# Patient Record
Sex: Male | Born: 2000 | Race: White | Hispanic: No | Marital: Single | Smoking: Current some day smoker
Health system: Southern US, Community
[De-identification: ages and names within clinical notes are randomized; demographics above are authoritative.]

## PROBLEM LIST (undated history)

## (undated) DIAGNOSIS — T7840XA Allergy, unspecified, initial encounter: Secondary | ICD-10-CM

## (undated) DIAGNOSIS — Z8781 Personal history of (healed) traumatic fracture: Secondary | ICD-10-CM

## (undated) HISTORY — DX: Personal history of (healed) traumatic fracture: Z87.81

## (undated) HISTORY — PX: WISDOM TOOTH EXTRACTION: SHX21

## (undated) HISTORY — DX: Allergy, unspecified, initial encounter: T78.40XA

---
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
## 2018-04-17 IMAGING — CT CT MAXILLOFACIAL W/O CM
4 of 7 series · 16 of 47 positions shown, 18 images · non-contrast
Comparison: None.

CLINICAL DATA: Loss of consciousness at site of blood. Fall. Bloody
nose and neck pain. Initial encounter.

EXAM:
CT MAXILLOFACIAL WITHOUT CONTRAST
CT CERVICAL SPINE WITHOUT CONTRAST
TECHNIQUE: Multidetector CT imaging of the maxillofacial structures was
performed. Multiplanar CT image reconstructions were also generated.
A small metallic BB was placed on the right temple in order to
reliably differentiate right from left.
Multidetector CT imaging of the cervical spine was performed without
intravenous contrast. Multiplanar CT image reconstructions were also
generated.

[Series 12: c spine soft · axial · 0.23mm/px · z∈[-117,-27]mm · 5 of 90 slices shown]
[im 9/90  brain]
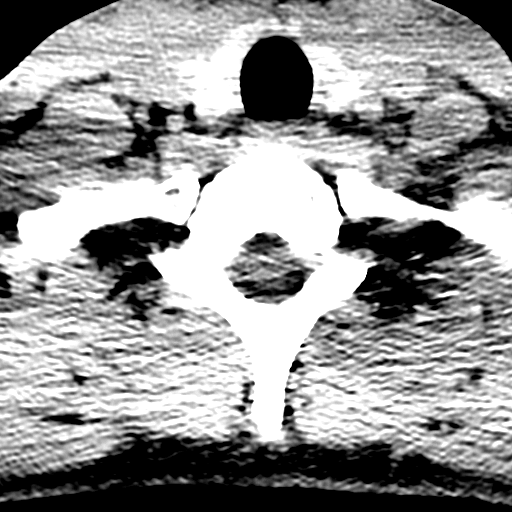
[im 18/90  brain]
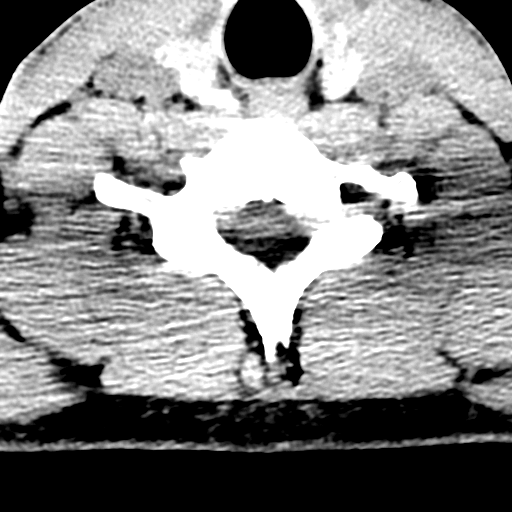
[im 27/90  brain]
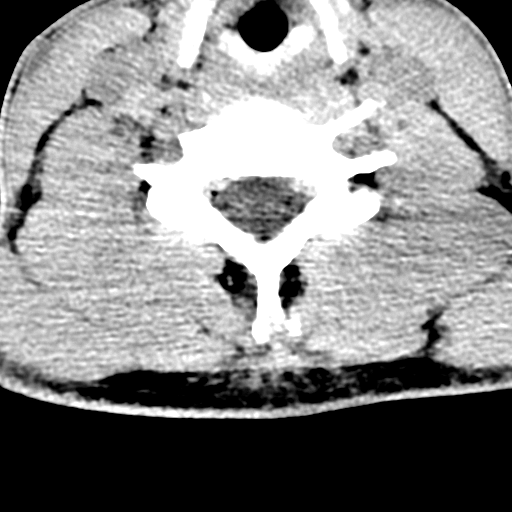
[im 36/90  brain]
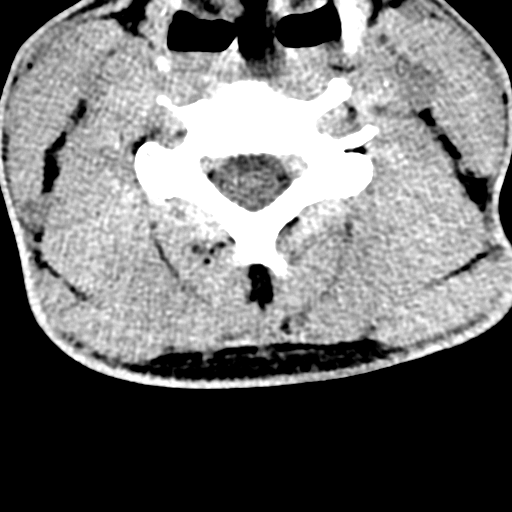
[im 54/90  brain]
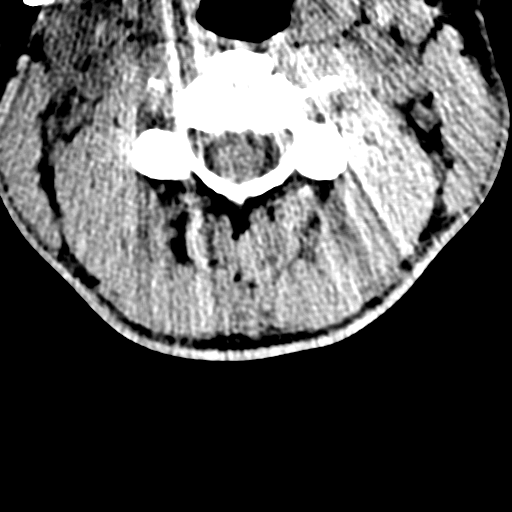

[Series 13: sagittal bone · sagittal · 0.26mm/px · 1 of 53 slices shown]
[im 27/53  bone]
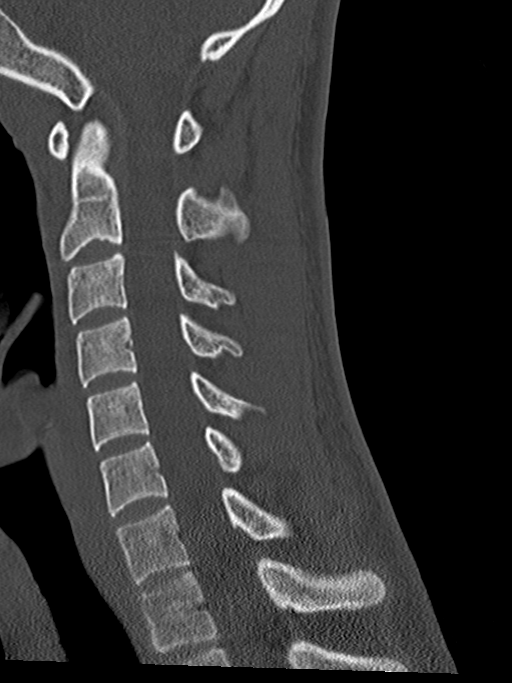

[Series 14: coronal bone · coronal · 0.26mm/px · 2 of 43 slices shown]
[im 15/43  bone]
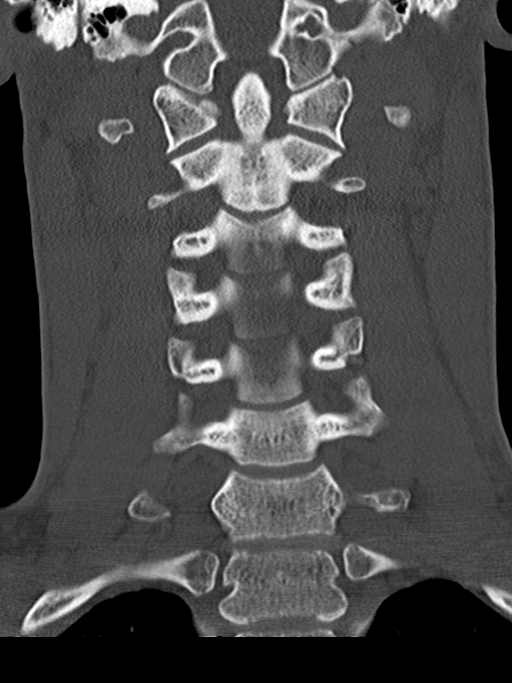
[im 29/43  bone]
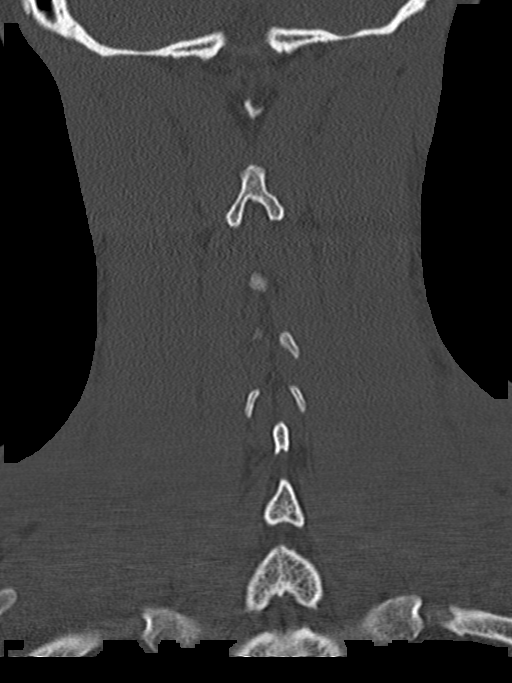

[Series 15: orthogonal axials · axial · 0.21mm/px · z∈[-128,-7]mm · 8 of 84 slices shown, 10 images]
[im 10/84  brain]
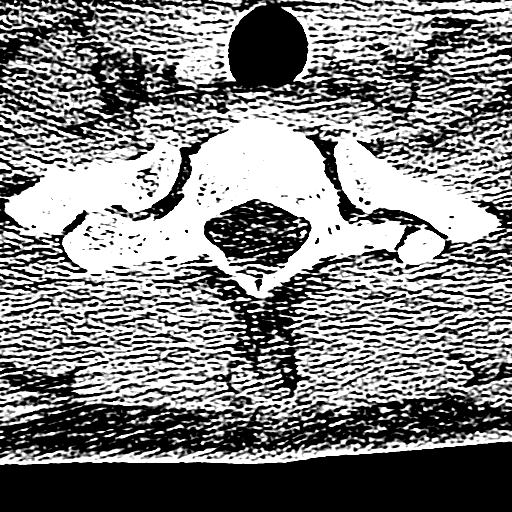
[im 10/84  bone]
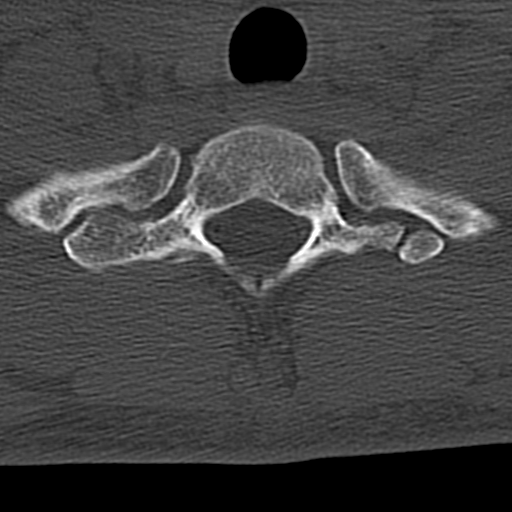
[im 19/84  bone]
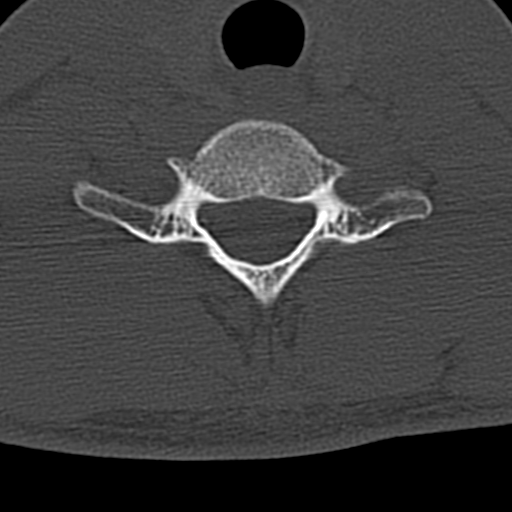
[im 28/84  bone]
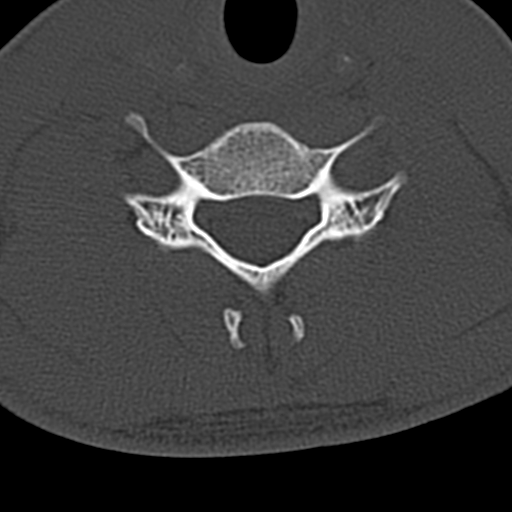
[im 37/84  bone]
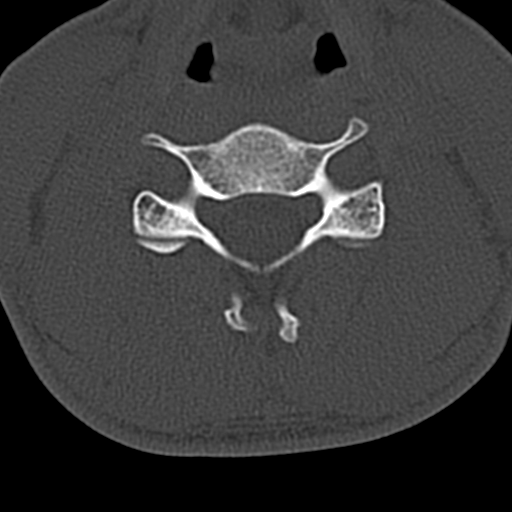
[im 47/84  brain]
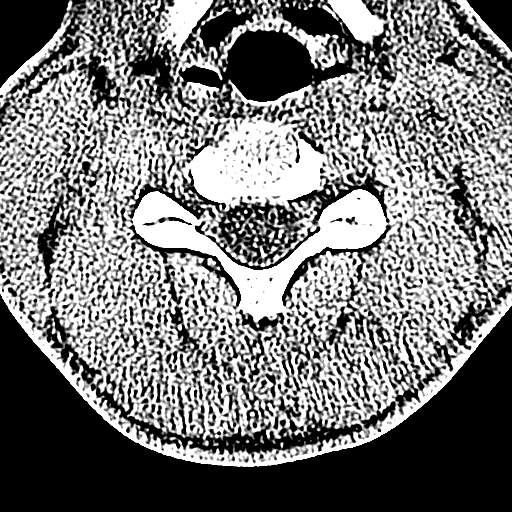
[im 47/84  bone]
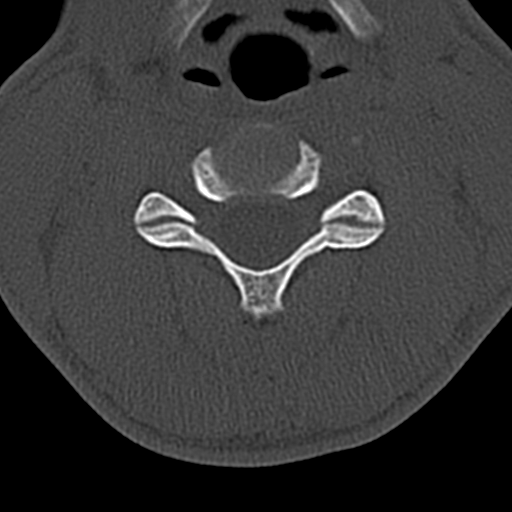
[im 56/84  bone]
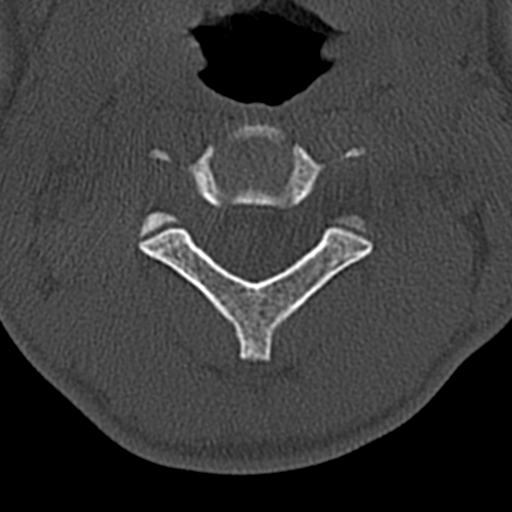
[im 65/84  bone]
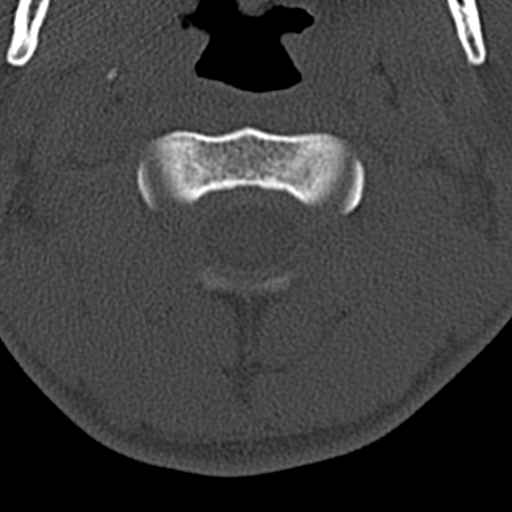
[im 74/84  bone]
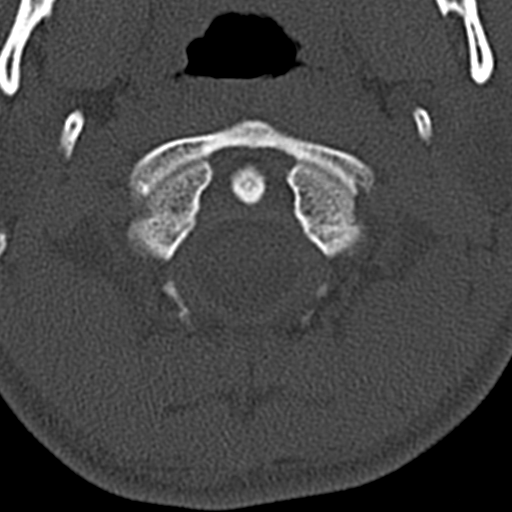

[16 of 47 positions shown; findings below may reference images not displayed]

FINDINGS: CT MAXILLOFACIAL FINDINGS

Osseous: Minimally depressed fracture of the nasal arch that is most
convincing on sagittal reformats. Intact nasal septum. No orbital or
ethmoid continuation.

Orbits: No evidence of injury

Sinuses: Negative.  No hemosinus.

Soft tissues: No contusion or opaque foreign body.

Limited intracranial: Negative

CT CERVICAL FINDINGS

Alignment: Normal

Skull base and vertebrae: Negative for fracture

Soft tissues and spinal canal: No prevertebral fluid or swelling. No
visible canal hematoma.

Disc levels:  No degenerative changes

Upper chest: Minimal coverage that is negative.
IMPRESSION: Maxillofacial CT:

Nasal arch fracture with mild depression.

Cervical spine CT:

Normal.

## 2018-11-23 ENCOUNTER — Ambulatory Visit (INDEPENDENT_AMBULATORY_CARE_PROVIDER_SITE_OTHER): Payer: Self-pay | Admitting: Family Medicine

## 2018-11-23 ENCOUNTER — Encounter: Payer: Self-pay | Admitting: Family Medicine

## 2018-11-23 ENCOUNTER — Other Ambulatory Visit: Payer: Self-pay

## 2018-11-23 DIAGNOSIS — R1013 Epigastric pain: Secondary | ICD-10-CM

## 2018-11-23 MED ORDER — PANTOPRAZOLE SODIUM 40 MG PO TBEC: DELAYED_RELEASE_TABLET | ORAL | 5 refills | Status: DC

## 2018-11-23 NOTE — Progress Notes (Signed)
   Subjective:  Audio  Patient ID: Alexander Gilbert, male    DOB: [birth year], 18 y.o.   MRN: [id number]  HPI Pt states he has been having stomach issues since freshman year in high school. Pt states that sometimes his stomach hurts for a few hours sometimes not at all. Pt states has has an area on his buttock that will be hard and painful at times and other times the area will be small and non painful. Pt has popped the area with a needle.   Virtual Visit via Video Note  I connected with Rylend Pietrzak on 11/23/18 at 11:00 AM EDT by a video enabled telemedicine application and verified that I am speaking with the correct person using two identifiers.  Location: Patient: home Provider: office   I discussed the limitations of evaluation and management by telemedicine and the availability of in person appointments. The patient expressed understanding and agreed to proceed.  History of Present Illness:    Observations/Objective:   Assessment and Plan:   Follow Up Instructions:    I discussed the assessment and treatment plan with the patient. The patient was provided an opportunity to ask questions and all were answered. The patient agreed with the plan and demonstrated an understanding of the instructions.   The patient was advised to call back or seek an in-person evaluation if the symptoms worsen or if the condition fails to improve as anticipated.  I provided 37minutes of non-face-to-face time during this encounter.   Vicente Males, LPN  Patient has intermittent cysts perirectal region.  Swells up a bit and then will drain and then goes almost gone away.  Has had it for a long time  Abdominal discomfort mid abdomen.  Usually worse after meals.  Uncomfortable sensation.  Has seen gastroenterologist in the past, even though he had forgotte.  No major change in bowel habits.  Review of Systems No headache, no major weight loss or weight gain, no chest pain no back pain abdominal pain  no change in bowel habits complete ROS otherwise negative     Objective:   Physical Exam   Virtual     Assessment & Plan:  Impression recurrent cyst perirectal at this time quiet patient wishes to pursue no further  2.  Reflux Protonix prescribed symptom care discussed if persists will consider GI referral exam

## 2019-06-07 ENCOUNTER — Encounter: Payer: Self-pay | Admitting: Family Medicine

## 2019-06-08 ENCOUNTER — Encounter: Payer: Self-pay | Admitting: Family Medicine

## 2020-05-16 ENCOUNTER — Encounter: Payer: Self-pay | Admitting: Family Medicine

## 2020-05-16 ENCOUNTER — Other Ambulatory Visit: Payer: Self-pay

## 2020-05-16 ENCOUNTER — Ambulatory Visit (INDEPENDENT_AMBULATORY_CARE_PROVIDER_SITE_OTHER): Payer: Self-pay | Admitting: Family Medicine

## 2020-05-16 VITALS — HR 65 | Temp 97.5°F | Resp 16 | Ht 69.0 in

## 2020-05-16 DIAGNOSIS — H66002 Acute suppurative otitis media without spontaneous rupture of ear drum, left ear: Secondary | ICD-10-CM | POA: Insufficient documentation

## 2020-05-16 DIAGNOSIS — J029 Acute pharyngitis, unspecified: Secondary | ICD-10-CM

## 2020-05-16 DIAGNOSIS — H66001 Acute suppurative otitis media without spontaneous rupture of ear drum, right ear: Secondary | ICD-10-CM

## 2020-05-16 LAB — POCT RAPID STREP A (OFFICE): Rapid Strep A Screen: NEGATIVE

## 2020-05-16 MED ORDER — AMOXICILLIN 500 MG PO CAPS: 500.0000 mg | ORAL_CAPSULE | Freq: Two times a day (BID) | ORAL | 0 refills | Status: DC

## 2020-05-16 NOTE — Progress Notes (Signed)
Patient ID: Alexander Gilbert, male    DOB: Sep 20, 2000, 20 y.o.   MRN: [id number]   Chief Complaint  Patient presents with  . Sore Throat   Subjective:  CC: sore throat  This is a new problem.  Presents today for an acute visit with a complaint of sore throat and fever.  He has not had a fever recently, associated symptoms include congestion, cough, and swollen tonsils.  Symptoms started 2 weeks ago.  Has tried ibuprofen with minimal relief.  Has a history of strep throat as a child.  Denies chills, fatigue, shortness of breath, chest pain.  Sore Throat  This is a new problem. Episode onset: 3 weeks. Maximum temperature: Fever at start of symptoms. Associated symptoms include congestion, coughing and ear pain. Pertinent negatives include no abdominal pain or shortness of breath. Associated symptoms comments: Coughing up chunks bloody mucous. Swollen tonsils.  . Treatments tried: ibuprofen.     Medical History Terrance has a past medical history of Allergy and fracture of arm (age 32 or 4).   Outpatient Encounter Medications as of 05/16/2020  Medication Sig  . amoxicillin (AMOXIL) 500 MG capsule Take 1 capsule (500 mg total) by mouth 2 (two) times daily.  Marland Kitchen OVER THE COUNTER MEDICATION Allergy med otc as needed prilosec otc as needed  . pantoprazole (PROTONIX) 40 MG tablet Take one tablet by mouth each day  . [DISCONTINUED] pantoprazole (PROTONIX) 40 MG tablet Take 1 tablet (40 mg total) by mouth daily. (Patient not taking: Reported on 02/08/2017)  . [DISCONTINUED] ranitidine (ZANTAC) 300 MG tablet Take 1 tablet (300 mg total) by mouth at bedtime as needed for heartburn. (Patient not taking: Reported on 02/08/2017)   No facility-administered encounter medications on file as of 05/16/2020.     Review of Systems  Constitutional: Positive for fever. Negative for chills.  HENT: Positive for congestion, ear pain and sore throat.   Respiratory: Positive for cough. Negative for shortness of breath.    Cardiovascular: Negative for chest pain.  Gastrointestinal: Negative for abdominal pain.     Vitals Pulse 65   Temp (!) 97.5 F (36.4 C)   Resp 16   Ht 5\' 9"  (1.753 m)   SpO2 100%   BMI 19.94 kg/m   Objective:   Physical Exam Vitals reviewed.  Constitutional:      General: He is not in acute distress.    Appearance: He is not ill-appearing.  HENT:     Right Ear: Tympanic membrane is erythematous.     Left Ear: Tympanic membrane normal.     Mouth/Throat:     Mouth: Mucous membranes are moist.     Pharynx: Uvula midline. Posterior oropharyngeal erythema present. No uvula swelling.     Tonsils: 1+ on the right. 1+ on the left.  Cardiovascular:     Rate and Rhythm: Normal rate and regular rhythm.     Heart sounds: Normal heart sounds.  Pulmonary:     Effort: Pulmonary effort is normal.     Breath sounds: Normal breath sounds.  Abdominal:     General: Bowel sounds are normal.  Skin:    General: Skin is warm and dry.  Neurological:     General: No focal deficit present.     Mental Status: He is alert.  Psychiatric:        Behavior: Behavior normal.      Assessment and Plan   1. Sore throat - Novel Coronavirus, NAA ([facility]) - Culture, Group A Strep -  POCT rapid strep A  2. Non-recurrent acute suppurative otitis media of right ear without spontaneous rupture of tympanic membrane - amoxicillin (AMOXIL) 500 MG capsule; Take 1 capsule (500 mg total) by mouth 2 (two) times daily.  Dispense: 14 capsule; Refill: 0   Rapid strep negative, will send for culture.  Right TM erythematous, will treat for otitis media.  Recommend supportive therapy, adequate hydration, over-the-counter medications to treat symptoms.  Agrees with plan of care discussed today. Understands warning signs to seek further care: Chest pain, shortness of breath, any significant change in health. Understands to follow-up if symptoms do not resolve, or worsen.   Dorena Bodo, FNP-C

## 2020-05-18 LAB — NOVEL CORONAVIRUS, NAA: SARS-CoV-2, NAA: NOT DETECTED

## 2020-05-18 LAB — SARS-COV-2, NAA 2 DAY TAT

## 2020-05-19 LAB — SPECIMEN STATUS REPORT

## 2020-05-19 LAB — CULTURE, GROUP A STREP

## 2020-07-18 ENCOUNTER — Encounter: Payer: Self-pay | Admitting: Family Medicine

## 2020-07-18 ENCOUNTER — Other Ambulatory Visit: Payer: Self-pay

## 2020-07-18 ENCOUNTER — Ambulatory Visit (INDEPENDENT_AMBULATORY_CARE_PROVIDER_SITE_OTHER): Payer: Self-pay | Admitting: Family Medicine

## 2020-07-18 VITALS — HR 80 | Temp 98.3°F | Resp 16

## 2020-07-18 DIAGNOSIS — J020 Streptococcal pharyngitis: Secondary | ICD-10-CM

## 2020-07-18 DIAGNOSIS — J358 Other chronic diseases of tonsils and adenoids: Secondary | ICD-10-CM

## 2020-07-18 DIAGNOSIS — R1310 Dysphagia, unspecified: Secondary | ICD-10-CM

## 2020-07-18 DIAGNOSIS — R070 Pain in throat: Secondary | ICD-10-CM | POA: Insufficient documentation

## 2020-07-18 MED ORDER — AMOXICILLIN-POT CLAVULANATE 875-125 MG PO TABS: 1.0000 | ORAL_TABLET | Freq: Two times a day (BID) | ORAL | 0 refills | Status: DC

## 2020-07-18 MED ORDER — PENICILLIN V POTASSIUM 500 MG PO TABS: ORAL_TABLET | ORAL | 0 refills | Status: DC

## 2020-07-18 MED ORDER — PREDNISONE 20 MG PO TABS: ORAL_TABLET | ORAL | 0 refills | Status: DC

## 2020-07-18 NOTE — Patient Instructions (Signed)
Recommend supportive therapy while you are recovering:   1) Get lots of rest.  2) Take over the counter pain medication if needed, such as acetaminophen or ibuprofen. Read and follow instructions on the label and make sure not to combine other medications that may have same ingredients in it. It is important to not take too much of these ingredients.  3) Drink plenty of caffeine-free fluids. (If you have heart or kidney problems, follow the instructions of your specialist regarding amounts).  4) If you are hungry, eat a bland diet, such as the BRAT diet (bananas, rice, applesauce, toast).  5) Let us know if you are not feeling better in a week.    Strep Throat, Adult Strep throat is an infection of the throat. It is caused by germs (bacteria). Strep throat is common during the cold months of the year. It mostly affects children who are 41-82 years old. However, people of all ages can get it at any time of the year. When strep throat affects the tonsils, it is called tonsillitis. When it affects the back of the throat, it is called pharyngitis. This infection spreads from person to person through coughing, sneezing, or having close contact. What are the causes? This condition is caused by the Streptococcus pyogenes germ. What increases the risk? You are more likely to develop this condition if:  You care for young children. Children are more likely to get strep throat and may spread it to others.  You go to crowded places. Germs can spread easily in such places.  You kiss or touch someone who has strep throat. What are the signs or symptoms? Symptoms of this condition include:  Fever or chills.  Redness, swelling, or pain in the tonsils or throat.  Pain or trouble when swallowing.  White or yellow spots on the tonsils or throat.  Tender glands in the neck and under the jaw.  Bad breath.  Red rash all over the body. This is rare. How is this treated? This condition may be  treated with:  Medicines that kill germs (antibiotics).  Medicines that treat pain or fever. These include: ? Ibuprofen or acetaminophen. ? Aspirin, only for patients who are over the age of 29. ? Throat lozenges. ? Throat sprays. Follow these instructions at home: Medicines  Take over-the-counter and prescription medicines only as told by your doctor.  Take your antibiotic medicine as told by your doctor. Do not stop taking the antibiotic even if you start to feel better.   Eating and drinking  If you have trouble swallowing, eat soft foods until your throat feels better.  Drink enough fluid to keep your pee (urine) pale yellow.  To help with pain, you may have: ? Warm fluids, such as soup and tea. ? Cold fluids, such as frozen desserts or popsicles.   General instructions  Rinse your mouth (gargle) with a salt-water mixture 3-4 times a day or as needed. To make a salt-water mixture, dissolve -1 tsp (3-6 g) of salt in 1 cup (237 mL) of warm water.  Rest as much as you can.  Stay home from work or school until you have been taking antibiotics for 24 hours.  Avoid smoking or being around people who smoke.  Keep all follow-up visits as told by your doctor. This is important. How is this prevented?  Do not share food, drinking cups, or personal items. They can cause the germs to spread.  Wash your hands well with soap and water. Make  sure that all people in your house wash their hands well.  Have family members tested if they have a fever or a sore throat. They may need an antibiotic if they have strep throat.   Contact a doctor if:  You have swelling in your neck that keeps getting bigger.  You get a rash, cough, or earache.  You cough up a thick fluid that is green, yellow-brown, or bloody.  You have pain that does not get better with medicine.  Your symptoms get worse instead of getting better.  You have a fever. Get help right away if:  You vomit.  You  have a very bad headache.  Your neck hurts or feels stiff.  You have chest pain or are short of breath.  You have drooling, very bad throat pain, or changes in your voice.  Your neck is swollen, or the skin gets red and tender.  Your mouth is dry, or you are peeing less than normal.  You keep feeling more tired or have trouble waking up.  Your joints are red or painful. Summary  Strep throat is an infection of the throat. It is caused by germs (bacteria).  This infection can spread from person to person through coughing, sneezing, or having close contact.  Take your medicines, including antibiotics, as told by your doctor. Do not stop taking the antibiotic even if you start to feel better.  To prevent the spread of germs, wash your hands well with soap and water. Have others do the same. Do not share food, drinking cups, or personal items.  Get help right away if you have a bad headache, chest pain, shortness of breath, a stiff or painful neck, or you vomit. This information is not intended to replace advice given to you by your health care provider. Make sure you discuss any questions you have with your health care provider. Document Revised: 07/15/2018 Document Reviewed: 07/15/2018 Elsevier Patient Education  2021 [employer].

## 2020-07-18 NOTE — Progress Notes (Addendum)
Patient ID: Alexander Gilbert, male    DOB: May 21, 2000, 20 y.o.   MRN: [id number]   Chief Complaint  Patient presents with  . canker sore on tonsil    2-3 days   Subjective:  CC: Sore throat  This is a new problem.  Presents today with a complaint of sore throat and "canker sore on tonsil "also reports that he is having some pain right above the left and right ear area symptoms have been present for the past 2 to 3 days.  Also reports that he has been experiencing fatigue which has been for several months, has started on some vitamins and this is improved.  Unsure if he has been running a fever, denies chills chest pain shortness of breath.  Does report some headaches.    Medical History Alexander Gilbert has a past medical history of Allergy and fracture of arm (age 20 or 20).   Outpatient Encounter Medications as of 07/18/2020  Medication Sig  . penicillin v potassium (VEETID) 500 MG tablet Take one tablet by mouth two times per day for 10 days.  . predniSONE (DELTASONE) 20 MG tablet Take 2 tablets by mouth for 5 days.  . [DISCONTINUED] amoxicillin-clavulanate (AUGMENTIN) 875-125 MG tablet Take 1 tablet by mouth 2 (two) times daily.  Marland Kitchen OVER THE COUNTER MEDICATION Allergy med otc as needed prilosec otc as needed  . pantoprazole (PROTONIX) 40 MG tablet Take one tablet by mouth each day  . [DISCONTINUED] amoxicillin (AMOXIL) 500 MG capsule Take 1 capsule (500 mg total) by mouth 2 (two) times daily. (Patient not taking: Reported on 07/18/2020)   No facility-administered encounter medications on file as of 07/18/2020.     Review of Systems  Constitutional: Negative for chills and fever.       Not sure if has had fever.  HENT: Positive for ear pain, mouth sores and sore throat.   Eyes: Positive for photophobia.  Respiratory: Negative for cough and shortness of breath.   Cardiovascular: Negative for chest pain.  Gastrointestinal: Positive for abdominal pain.       Not new  Neurological: Positive for  headaches.     Vitals Pulse 80   Temp 98.3 F (36.8 C)   Resp 16   SpO2 98%   Objective:   Physical Exam Vitals reviewed.  HENT:     Right Ear: Tympanic membrane normal.     Left Ear: Tympanic membrane is erythematous and bulging.     Mouth/Throat:     Pharynx: Uvula midline. Posterior oropharyngeal erythema present.     Tonsils: Tonsillar exudate present. No tonsillar abscesses. 1+ on the right. 1+ on the left.     Comments: Large exudate on left tonsil. Reports difficulty swallowing. No obvious shortness of breath, no drooling.  Cardiovascular:     Rate and Rhythm: Normal rate and regular rhythm.     Heart sounds: Normal heart sounds.  Pulmonary:     Effort: Pulmonary effort is normal.     Breath sounds: Normal breath sounds.  Skin:    General: Skin is warm and dry.  Neurological:     General: No focal deficit present.     Mental Status: He is alert.  Psychiatric:        Behavior: Behavior normal.         Assessment and Plan   1. Tonsillar exudate - Novel Coronavirus, NAA ([facility])  2. Streptococcal sore throat - penicillin v potassium (VEETID) 500 MG tablet; Take one tablet by mouth two  times per day for 10 days.  Dispense: 20 tablet; Refill: 0 - Novel Coronavirus, NAA ([facility])  3. Impaired swallowing associated with throat pain - predniSONE (DELTASONE) 20 MG tablet; Take 2 tablets by mouth for 5 days.  Dispense: 10 tablet; Refill: 0  Rapid strep positive, will treat with antibiotic for 10 days.  Will treat throat pain and difficulty swallowing with a short burst of prednisone.  Uvula midline, no obvious shortness of breath, able to converse throughout visit, no drooling.  Airway intact.  Recommend Tylenol for pain, instructed to never take more than 3000 mg of Tylenol in 24 hours.  Continue warm salt water gargles.   Agrees with plan of care discussed today. Understands warning signs to seek further care: chest pain, shortness of breath, any  significant change in health.  Understands to follow-up if symptoms worsen, do not improve.  Instructed to complete full course of antibiotics and prednisone burst.  Dorena Bodo, NP 07/18/20

## 2020-07-19 LAB — SARS-COV-2, NAA 2 DAY TAT

## 2020-07-19 LAB — NOVEL CORONAVIRUS, NAA: SARS-CoV-2, NAA: NOT DETECTED

## 2021-04-22 ENCOUNTER — Ambulatory Visit (INDEPENDENT_AMBULATORY_CARE_PROVIDER_SITE_OTHER): Payer: Self-pay | Admitting: Family Medicine

## 2021-04-22 ENCOUNTER — Encounter: Payer: Self-pay | Admitting: Family Medicine

## 2021-04-22 ENCOUNTER — Other Ambulatory Visit: Payer: Self-pay

## 2021-04-22 VITALS — BP 120/78 | HR 96 | Temp 97.2°F | Ht 69.0 in | Wt 134.6 lb

## 2021-04-22 DIAGNOSIS — K602 Anal fissure, unspecified: Secondary | ICD-10-CM | POA: Insufficient documentation

## 2021-04-22 MED ORDER — DOCUSATE SODIUM 100 MG PO CAPS: 100.0000 mg | ORAL_CAPSULE | Freq: Two times a day (BID) | ORAL | 0 refills | Status: DC

## 2021-04-22 NOTE — Progress Notes (Signed)
Subjective:  Patient ID: Alexander Gilbert, male    DOB: [birth year]  Age: 20 y.o. MRN: [id number]  CC: Chief Complaint  Patient presents with   Anal Fissure    Possible anal fissure. Pt states it has become so painful that he is unable to go to work. Has been going on for about a year. Comes and goes.     HPI:  20 year old male presents for evaluation of the above.  Patient reports that he has had ongoing rectal pain intermittently for the past year.  Worse at times.  He states that his parents have taken a look at the area.  He is concerned that he has an anal fissure.  He states that for the most part his bowel movements are soft but he does have some times where bowel movements or more hard.  No reports of hemorrhoids.  He has applied topical Preparation H without resolution.  He states that the area is quite painful.  He states that it is on the top aspect of his anus.  No other complaints or concerns.  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   GERD (gastroesophageal reflux disease) 11/02/2016   Allergic rhinitis 08/18/2013    Social Hx   Social History   Socioeconomic History   Marital status: Single    Spouse name: Not on file   Number of children: Not on file   Years of education: Not on file   Highest education level: Not on file  Occupational History   Not on file  Tobacco Use   Smoking status: Never   Smokeless tobacco: Never  Substance and Sexual Activity   Alcohol use: No   Drug use: Yes    Types: Marijuana    Comment: last used marijuana yesterday   Sexual activity: Not on file  Other Topics Concern   Not on file  Social History Narrative   Not on file   Social Determinants of Health   Financial Resource Strain: Not on file  Food Insecurity: Not on file  Transportation Needs: Not on file  Physical Activity: Not on file  Stress: Not on file  Social Connections: Not on file    Review of Systems  Constitutional:  Negative.   Gastrointestinal:  Positive for rectal pain.    Objective:  BP 120/78    Pulse 96    Temp (!) 97.2 F (36.2 C)    Ht 5\' 9"  (1.753 m)    Wt 134 lb 9.6 oz (61.1 kg)    SpO2 100%    BMI 19.88 kg/m   BP/Weight 04/22/2021 05/16/2020 04/20/2017  Systolic BP 120 - 117  Diastolic BP 78 - 64  Wt. (Lbs) 134.6 - 135  BMI 19.88 19.94 19.94    Physical Exam Constitutional:      General: He is not in acute distress.    Appearance: Normal appearance.  HENT:     Head: Normocephalic and atraumatic.  Pulmonary:     Effort: Pulmonary effort is normal. No respiratory distress.  Genitourinary:    Rectum: Anal fissure present.       Comments: Rectal exam: No hemorrhoids noted.  Small fissure noted at the labeled location. Neurological:     Mental Status: He is alert.  Psychiatric:        Mood and Affect: Mood normal.        Behavior: Behavior normal.    Lab Results  Component Value Date   WBC  5.8 02/08/2017   HGB 16.5 02/08/2017   HCT 46.1 02/08/2017   PLT 169 02/08/2017   GLUCOSE 95 02/08/2017   ALT 18 02/08/2017   AST 16 02/08/2017   NA 143 02/08/2017   K 4.4 02/08/2017   CL 101 02/08/2017   CREATININE 0.74 (L) 02/08/2017   BUN 15 02/08/2017   CO2 30 (H) 02/08/2017   TSH 2.450 02/08/2017     Assessment & Plan:   Problem List Items Addressed This Visit       Digestive   Anal fissure - Primary    Patient has an anal fissure on exam.  Treating with topical nifedipine and lidocaine (compounded through [employer]).  Advised to increase fiber in his diet.  Colace as directed.  Follow-up in 1 month.       Meds ordered this encounter  Medications   docusate sodium (COLACE) 100 MG capsule    Sig: Take 1 capsule (100 mg total) by mouth 2 (two) times daily.    Dispense:  60 capsule    Refill:  0    Follow-up:  Return in about 1 month (around 05/23/2021).  Everlene Other DO [facility]

## 2021-04-22 NOTE — Assessment & Plan Note (Signed)
Patient has an anal fissure on exam.  Treating with topical nifedipine and lidocaine (compounded through [employer]).  Advised to increase fiber in his diet.  Colace as directed.  Follow-up in 1 month.

## 2021-05-27 ENCOUNTER — Encounter: Payer: Self-pay | Admitting: Family Medicine

## 2021-05-27 ENCOUNTER — Ambulatory Visit: Payer: Self-pay | Admitting: Family Medicine

## 2022-03-26 ENCOUNTER — Encounter: Payer: Self-pay | Admitting: Gastroenterology

## 2022-03-26 ENCOUNTER — Encounter: Payer: Self-pay | Admitting: Family Medicine

## 2022-03-26 ENCOUNTER — Ambulatory Visit (INDEPENDENT_AMBULATORY_CARE_PROVIDER_SITE_OTHER): Payer: Medicaid Other | Admitting: Family Medicine

## 2022-03-26 VITALS — BP 126/79 | HR 90 | Temp 98.1°F | Wt 136.6 lb

## 2022-03-26 DIAGNOSIS — K602 Anal fissure, unspecified: Secondary | ICD-10-CM

## 2022-03-26 DIAGNOSIS — R1030 Lower abdominal pain, unspecified: Secondary | ICD-10-CM | POA: Insufficient documentation

## 2022-03-26 MED ORDER — NITROGLYCERIN 0.4 % RE OINT: TOPICAL_OINTMENT | RECTAL | 1 refills | Status: DC

## 2022-03-26 NOTE — Patient Instructions (Signed)
Medication as prescribed.  Referral to GI placed.  Take care  Dr. Adriana Simas

## 2022-03-26 NOTE — Progress Notes (Signed)
Subjective:  Patient ID: Alexander Gilbert, male    DOB: 19-Jul-2000  Age: 21 y.o. MRN: [id number]  CC: Chief Complaint  Patient presents with   Groin Pain    Pt arrives with groin swelling and pain. Tender to touch. 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 Noticed on Saturday night-didn't lift anything that day.  Anal Fissure -had it looked at it about one year ago. Can get it to heal for 3 months then it opens up and is painful to the point where he can not work.    HPI:  21 year old male presents for evaluation of the above.  Patient reports that over the weekend he developed pain and swelling in the groin bilaterally.  Left greater than right.  He states that he does heavy lifting at work.  He had his dad examine the area.  Concern for hernia.  This prompted his evaluation today.  He states that it seems to be improved currently.  He would like me to examine it.  Patient also reports continued intermittent difficulty with anal fissure.  Response to topical nifedipine and then he has no symptoms for approximately 3 months and it recurs again.  He states that he is not having any constipation.  He is having frequent stools.  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   GERD (gastroesophageal reflux disease) 11/02/2016   Allergic rhinitis 08/18/2013    Social Hx   Social History   Socioeconomic History   Marital status: Single    Spouse name: Not on file   Number of children: Not on file   Years of education: Not on file   Highest education level: Not on file  Occupational History   Not on file  Tobacco Use   Smoking status: Never   Smokeless tobacco: Never  Substance and Sexual Activity   Alcohol use: No   Drug use: Yes    Types: Marijuana    Comment: last used marijuana yesterday   Sexual activity: Not on file  Other Topics Concern   Not on file  Social History Narrative   Not on file   Social Determinants of Health    Financial Resource Strain: Not on file  Food Insecurity: Not on file  Transportation Needs: Not on file  Physical Activity: Not on file  Stress: Not on file  Social Connections: Not on file    Review of Systems Per HPI  Objective:  BP 126/79   Pulse 90   Temp 98.1 F (36.7 C)   Wt 136 lb 9.6 oz (62 kg)   SpO2 99%   BMI 20.17 kg/m      03/26/2022    9:27 AM 04/22/2021   10:01 AM 04/20/2017    2:58 PM  BP/Weight  Systolic BP 126 120 117  Diastolic BP 79 78 64  Wt. (Lbs) 136.6 134.6   BMI 20.17 kg/m2 19.88 kg/m2     Physical Exam Vitals and nursing note reviewed.  Constitutional:      General: He is not in acute distress.    Appearance: Normal appearance.  HENT:     Head: Normocephalic and atraumatic.  Pulmonary:     Effort: Pulmonary effort is normal. No respiratory distress.  Abdominal:     Hernia: There is no hernia in the left inguinal area or right inguinal area.  Genitourinary:    Testes: Normal.     Comments: No current anal fissure.  Appears healed at this time. Neurological:     Mental Status: He is alert.  Psychiatric:        Mood and Affect: Mood normal.        Behavior: Behavior normal.     Lab Results  Component Value Date   WBC 5.8 02/08/2017   HGB 16.5 02/08/2017   HCT 46.1 02/08/2017   PLT 169 02/08/2017   GLUCOSE 95 02/08/2017   ALT 18 02/08/2017   AST 16 02/08/2017   NA 143 02/08/2017   K 4.4 02/08/2017   CL 101 02/08/2017   CREATININE 0.74 (L) 02/08/2017   BUN 15 02/08/2017   CO2 30 (H) 02/08/2017   TSH 2.450 02/08/2017     Assessment & Plan:   Problem List Items Addressed This Visit       Digestive   Anal fissure - Primary    Recurrent.  Advised use of nitroglycerin ointment.  Referring to GI.      Relevant Orders   Ambulatory referral to Gastroenterology     Other   Groin pain    Normal exam today.  Supportive care.       Meds ordered this encounter  Medications   Nitroglycerin 0.4 % OINT    Sig:  Apply 1 inch (375 mg) ointment intra-anally twice daily    Dispense:  30 g    Refill:  1    Anndrea Mihelich DO [facility]

## 2022-03-26 NOTE — Assessment & Plan Note (Signed)
Recurrent.  Advised use of nitroglycerin ointment.  Referring to GI.

## 2022-03-26 NOTE — Assessment & Plan Note (Signed)
Normal exam today.  Supportive care.

## 2022-03-30 ENCOUNTER — Telehealth: Payer: Self-pay | Admitting: Family Medicine

## 2022-03-30 NOTE — Telephone Encounter (Signed)
Patient is requesting a cheaper ointment called into [facility] [address] can't afford the one that was sent its $600 dollars and he has no insurance. Please advise

## 2022-03-31 ENCOUNTER — Other Ambulatory Visit: Payer: Self-pay | Admitting: Family Medicine

## 2022-03-31 NOTE — Telephone Encounter (Signed)
Patient notified

## 2022-03-31 NOTE — Telephone Encounter (Signed)
Everlene Other G, DO     If he has a still available, I would use the compounded nifedipine that I prescribed previously.

## 2022-05-07 ENCOUNTER — Ambulatory Visit (INDEPENDENT_AMBULATORY_CARE_PROVIDER_SITE_OTHER): Payer: Medicaid Other | Admitting: Gastroenterology

## 2022-05-07 ENCOUNTER — Telehealth: Payer: Self-pay | Admitting: *Deleted

## 2022-05-07 ENCOUNTER — Encounter: Payer: Self-pay | Admitting: Gastroenterology

## 2022-05-07 ENCOUNTER — Encounter: Payer: Self-pay | Admitting: *Deleted

## 2022-05-07 VITALS — BP 124/81 | HR 65 | Temp 98.0°F | Ht 69.0 in | Wt 137.8 lb

## 2022-05-07 DIAGNOSIS — K589 Irritable bowel syndrome without diarrhea: Secondary | ICD-10-CM | POA: Insufficient documentation

## 2022-05-07 DIAGNOSIS — K580 Irritable bowel syndrome with diarrhea: Secondary | ICD-10-CM

## 2022-05-07 DIAGNOSIS — K602 Anal fissure, unspecified: Secondary | ICD-10-CM

## 2022-05-07 MED ORDER — RIFAXIMIN 550 MG PO TABS: 550.0000 mg | ORAL_TABLET | Freq: Three times a day (TID) | ORAL | 0 refills | Status: AC

## 2022-05-07 MED ORDER — PEG 3350-KCL-NA BICARB-NACL 420 G PO SOLR: 4000.0000 mL | Freq: Once | ORAL | 0 refills | Status: AC

## 2022-05-07 NOTE — H&P (View-Only) (Signed)
Gastroenterology Office Note    Referring Provider: Tommie Sams, DO Primary Care Physician:  Tommie Sams, DO  Primary GI: Dr. Marletta Lor    Chief Complaint   Chief Complaint  Patient presents with   Abdominal Pain    Lower abdominal pain. States that he does have issues with his bowels that will cause the fissure to open up. Currently no issues with the fissure.      History of Present Illness   Alexander Gilbert is a 21 y.o. male presenting today at the request of Tommie Sams, DO due to history of anal fissure. Patient last seen in 2018 by this practice.    Has intermittent issues with rectal pain for past 2 years. Bristol stool scale #4-6. At least once a week will have large amount of frequent stools in one day. Tries to eat lots of veggies. No fried foods. Will usually have BM daily. Occasional blood with wiping. Will have rectal throbbing when flares. When flares will have sharp pain with defecation but will try to avoid BM that day. Last flare up in November. About every 1.5 months will flare. Got a compounded medication with lidocaine. Couldn't afford nitro. Compounded nifedipine at [facility]. Used consistently when first had symptoms. Now just as needed. Will have some lower abdominal discomfort in mornings. Cold weather helps relieve. Gone after 30 minutes to an hour. Has a lot of gas.   Notices if drinking a glass of milk prior to going to bed, will have the frquent loose stools in the morning.   Has had chronic abdominal pain dating back many years to childhood.    Past Medical History:  Diagnosis Date   Allergy    Hx of fracture of arm age 69 or 4   right    Past Surgical History:  Procedure Laterality Date   FRACTURE SURGERY  2005   right elbow   WISDOM TOOTH EXTRACTION      Current Outpatient Medications  Medication Sig Dispense Refill   nifedipine 0.3 % ointment Place 1 Application rectally 4 (four) times daily.     rifaximin (XIFAXAN) 550 MG  TABS tablet Take 1 tablet (550 mg total) by mouth 3 (three) times daily for 14 days. 42 tablet 0   polyethylene glycol-electrolytes (NULYTELY) 420 g solution Take 4,000 mLs by mouth once for 1 dose. 4000 mL 0   No current facility-administered medications for this visit.    Allergies as of 05/07/2022   (No Known Allergies)    Family History  Problem Relation Age of Onset   Diabetes Maternal Grandmother    Diabetes Maternal Grandfather    Heart disease Paternal Grandmother    Heart disease Paternal Grandfather    Colon cancer Neg Hx    Gastric cancer Neg Hx    Esophageal cancer Neg Hx    Colon polyps Neg Hx     Social History   Socioeconomic History   Marital status: Single    Spouse name: Not on file   Number of children: Not on file   Years of education: Not on file   Highest education level: Not on file  Occupational History   Not on file  Tobacco Use   Smoking status: Some Days    Types: Cigarettes   Smokeless tobacco: Never  Substance and Sexual Activity   Alcohol use: Yes    Comment: rare   Drug use: Yes    Types: Marijuana    Comment: last used  marijuana yesterday   Sexual activity: Yes  Other Topics Concern   Not on file  Social History Narrative   Not on file   Social Determinants of Health   Financial Resource Strain: Not on file  Food Insecurity: Not on file  Transportation Needs: Not on file  Physical Activity: Not on file  Stress: Not on file  Social Connections: Not on file  Intimate Partner Violence: Not on file     Review of Systems   Gen: Denies any fever, chills, fatigue, weight loss, lack of appetite.  CV: Denies chest pain, heart palpitations, peripheral edema, syncope.  Resp: Denies shortness of breath at rest or with exertion. Denies wheezing or cough.  GI: Denies dysphagia or odynophagia. Denies jaundice, hematemesis, fecal incontinence. GU : Denies urinary burning, urinary frequency, urinary hesitancy MS: Denies joint pain,  muscle weakness, cramps, or limitation of movement.  Derm: Denies rash, itching, dry skin Psych: Denies depression, anxiety, memory loss, and confusion Heme: Denies bruising, bleeding, and enlarged lymph nodes.   Physical Exam   BP 124/81 (BP Location: Right Arm, Patient Position: Sitting, Cuff Size: Normal)   Pulse 65   Temp 98 F (36.7 C) (Oral)   Ht 5\' 9"  (1.753 m)   Wt 137 lb 12.8 oz (62.5 kg)   SpO2 98%   BMI 20.35 kg/m  General:   Alert and oriented. Pleasant and cooperative. Well-nourished and well-developed.  Head:  Normocephalic and atraumatic. Eyes:  Without icterus Ears:  Normal auditory acuity. Lungs:  Clear to auscultation bilaterally.  Heart:  S1, S2 present without murmurs appreciated.  Abdomen:  +BS, soft, non-tender and non-distended. No HSM noted. No guarding or rebound. No masses appreciated.  Rectal:  query small posterior fissure, no overt bleeding, discomfort with DRE noted.  Msk:  Symmetrical without gross deformities. Normal posture. Extremities:  Without edema. Neurologic:  Alert and  oriented x4;  grossly normal neurologically. Skin:  Intact without significant lesions or rashes. Psych:  Alert and cooperative. Normal mood and affect.   Assessment   Alexander Gilbert is a 21 y.o. male presenting today at the request of Dr. 30 due to history of anal fissure. He also notes intermittent spells of looser stool, chronic lower abdominal pain dating back to childhood, and low-volume hematochezia with wiping during flares of rectal pain.    Anal fissure: currently asymptomatic. Noting flares every 1.5 months. Has previously tried nifedipine compounded at [facility]. Currently taking prn. Low-volume hematochezia noted with discomfort when flaring.   Intermittent loose stool: suspect IBS, +/- lactose sensitivity as notes correlation with dairy products at times. Low-volume hematochezia suspected due to benign anorectal source. He does have chronic lower  abdominal pain dating back to childhood as well. Ttg, IgA was negative in May 2018.   Will arrange colonoscopy in near future to exclude any other underlying etiologies. Trial of Xifaxan for IBS-D, +/- SIBO.     PLAN   Proceed with colonoscopy by Dr. June 2018  in near future: the risks, benefits, and alternatives have been discussed with the patient in detail. The patient states understanding and desires to proceed.   Xifaxan TID X 14 days  3 month follow-up  Marletta Lor, PhD, ANP-BC [facility]

## 2022-05-07 NOTE — Patient Instructions (Signed)
I have sent Xifaxan to [facility] to take three times a day for just 14 days. Sometimes, this needs a prior authorization. If so, the pharmacy will let us know.  We are arranging a colonoscopy in the near future with Dr. Marletta Lor!  If pain flares up again, use the compounded cream from [facility] 3-4 times per day.   We will see you back in follow-up after the colonoscopy!  It was a pleasure to see you today. I want to create trusting relationships with patients to provide genuine, compassionate, and quality care. I value your feedback. If you receive a survey regarding your visit,  I greatly appreciate you taking time to fill this out.   Gelene Mink, PhD, ANP-BC [facility]

## 2022-05-07 NOTE — Telephone Encounter (Signed)
[employer] PA:  The notification/prior authorization case information was transmitted on 05/07/2022 at 3:35 PM CST. The notification/prior authorization reference number is [id number]

## 2022-05-07 NOTE — Progress Notes (Signed)
Gastroenterology Office Note    Referring Provider: Tommie Sams, DO Primary Care Physician:  Tommie Sams, DO  Primary GI: Dr. Marletta Lor    Chief Complaint   Chief Complaint  Patient presents with   Abdominal Pain    Lower abdominal pain. States that he does have issues with his bowels that will cause the fissure to open up. Currently no issues with the fissure.      History of Present Illness   Alexander Gilbert is a 21 y.o. male presenting today at the request of Tommie Sams, DO due to history of anal fissure. Patient last seen in 2018 by this practice.    Has intermittent issues with rectal pain for past 2 years. Bristol stool scale #4-6. At least once a week will have large amount of frequent stools in one day. Tries to eat lots of veggies. No fried foods. Will usually have BM daily. Occasional blood with wiping. Will have rectal throbbing when flares. When flares will have sharp pain with defecation but will try to avoid BM that day. Last flare up in November. About every 1.5 months will flare. Got a compounded medication with lidocaine. Couldn't afford nitro. Compounded nifedipine at [facility]. Used consistently when first had symptoms. Now just as needed. Will have some lower abdominal discomfort in mornings. Cold weather helps relieve. Gone after 30 minutes to an hour. Has a lot of gas.   Notices if drinking a glass of milk prior to going to bed, will have the frquent loose stools in the morning.   Has had chronic abdominal pain dating back many years to childhood.    Past Medical History:  Diagnosis Date   Allergy    Hx of fracture of arm age 69 or 4   right    Past Surgical History:  Procedure Laterality Date   FRACTURE SURGERY  2005   right elbow   WISDOM TOOTH EXTRACTION      Current Outpatient Medications  Medication Sig Dispense Refill   nifedipine 0.3 % ointment Place 1 Application rectally 4 (four) times daily.     rifaximin (XIFAXAN) 550 MG  TABS tablet Take 1 tablet (550 mg total) by mouth 3 (three) times daily for 14 days. 42 tablet 0   polyethylene glycol-electrolytes (NULYTELY) 420 g solution Take 4,000 mLs by mouth once for 1 dose. 4000 mL 0   No current facility-administered medications for this visit.    Allergies as of 05/07/2022   (No Known Allergies)    Family History  Problem Relation Age of Onset   Diabetes Maternal Grandmother    Diabetes Maternal Grandfather    Heart disease Paternal Grandmother    Heart disease Paternal Grandfather    Colon cancer Neg Hx    Gastric cancer Neg Hx    Esophageal cancer Neg Hx    Colon polyps Neg Hx     Social History   Socioeconomic History   Marital status: Single    Spouse name: Not on file   Number of children: Not on file   Years of education: Not on file   Highest education level: Not on file  Occupational History   Not on file  Tobacco Use   Smoking status: Some Days    Types: Cigarettes   Smokeless tobacco: Never  Substance and Sexual Activity   Alcohol use: Yes    Comment: rare   Drug use: Yes    Types: Marijuana    Comment: last used  marijuana yesterday   Sexual activity: Yes  Other Topics Concern   Not on file  Social History Narrative   Not on file   Social Determinants of Health   Financial Resource Strain: Not on file  Food Insecurity: Not on file  Transportation Needs: Not on file  Physical Activity: Not on file  Stress: Not on file  Social Connections: Not on file  Intimate Partner Violence: Not on file     Review of Systems   Gen: Denies any fever, chills, fatigue, weight loss, lack of appetite.  CV: Denies chest pain, heart palpitations, peripheral edema, syncope.  Resp: Denies shortness of breath at rest or with exertion. Denies wheezing or cough.  GI: Denies dysphagia or odynophagia. Denies jaundice, hematemesis, fecal incontinence. GU : Denies urinary burning, urinary frequency, urinary hesitancy MS: Denies joint pain,  muscle weakness, cramps, or limitation of movement.  Derm: Denies rash, itching, dry skin Psych: Denies depression, anxiety, memory loss, and confusion Heme: Denies bruising, bleeding, and enlarged lymph nodes.   Physical Exam   BP 124/81 (BP Location: Right Arm, Patient Position: Sitting, Cuff Size: Normal)   Pulse 65   Temp 98 F (36.7 C) (Oral)   Ht 5\' 9"  (1.753 m)   Wt 137 lb 12.8 oz (62.5 kg)   SpO2 98%   BMI 20.35 kg/m  General:   Alert and oriented. Pleasant and cooperative. Well-nourished and well-developed.  Head:  Normocephalic and atraumatic. Eyes:  Without icterus Ears:  Normal auditory acuity. Lungs:  Clear to auscultation bilaterally.  Heart:  S1, S2 present without murmurs appreciated.  Abdomen:  +BS, soft, non-tender and non-distended. No HSM noted. No guarding or rebound. No masses appreciated.  Rectal:  query small posterior fissure, no overt bleeding, discomfort with DRE noted.  Msk:  Symmetrical without gross deformities. Normal posture. Extremities:  Without edema. Neurologic:  Alert and  oriented x4;  grossly normal neurologically. Skin:  Intact without significant lesions or rashes. Psych:  Alert and cooperative. Normal mood and affect.   Assessment   Alexander Gilbert is a 21 y.o. male presenting today at the request of Dr. 30 due to history of anal fissure. He also notes intermittent spells of looser stool, chronic lower abdominal pain dating back to childhood, and low-volume hematochezia with wiping during flares of rectal pain.    Anal fissure: currently asymptomatic. Noting flares every 1.5 months. Has previously tried nifedipine compounded at [facility]. Currently taking prn. Low-volume hematochezia noted with discomfort when flaring.   Intermittent loose stool: suspect IBS, +/- lactose sensitivity as notes correlation with dairy products at times. Low-volume hematochezia suspected due to benign anorectal source. He does have chronic lower  abdominal pain dating back to childhood as well. Ttg, IgA was negative in May 2018.   Will arrange colonoscopy in near future to exclude any other underlying etiologies. Trial of Xifaxan for IBS-D, +/- SIBO.     PLAN   Proceed with colonoscopy by Dr. June 2018  in near future: the risks, benefits, and alternatives have been discussed with the patient in detail. The patient states understanding and desires to proceed.   Xifaxan TID X 14 days  3 month follow-up  Marletta Lor, PhD, ANP-BC [facility]

## 2022-05-15 ENCOUNTER — Other Ambulatory Visit: Payer: Self-pay

## 2022-05-15 ENCOUNTER — Ambulatory Visit (HOSPITAL_COMMUNITY)
Admission: RE | Admit: 2022-05-15 | Discharge: 2022-05-15 | Disposition: A | Discharge status: Home / Self Care | Payer: Medicaid Other | Attending: Internal Medicine | Admitting: Internal Medicine

## 2022-05-15 ENCOUNTER — Encounter (HOSPITAL_COMMUNITY)
Admission: RE | Disposition: A | Discharge status: Home / Self Care | Payer: Self-pay | Source: Home / Self Care | Attending: Internal Medicine

## 2022-05-15 ENCOUNTER — Ambulatory Visit (HOSPITAL_COMMUNITY): Payer: Medicaid Other | Admitting: Anesthesiology

## 2022-05-15 ENCOUNTER — Encounter (HOSPITAL_COMMUNITY): Payer: Self-pay

## 2022-05-15 DIAGNOSIS — K219 Gastro-esophageal reflux disease without esophagitis: Secondary | ICD-10-CM | POA: Diagnosis not present

## 2022-05-15 DIAGNOSIS — F1290 Cannabis use, unspecified, uncomplicated: Secondary | ICD-10-CM | POA: Diagnosis not present

## 2022-05-15 DIAGNOSIS — K625 Hemorrhage of anus and rectum: Secondary | ICD-10-CM

## 2022-05-15 DIAGNOSIS — F17210 Nicotine dependence, cigarettes, uncomplicated: Secondary | ICD-10-CM | POA: Insufficient documentation

## 2022-05-15 DIAGNOSIS — K648 Other hemorrhoids: Secondary | ICD-10-CM

## 2022-05-15 DIAGNOSIS — K602 Anal fissure, unspecified: Secondary | ICD-10-CM

## 2022-05-15 HISTORY — PX: COLONOSCOPY WITH PROPOFOL: SHX5780

## 2022-05-15 SURGERY — COLONOSCOPY WITH PROPOFOL
Anesthesia: General

## 2022-05-15 MED ORDER — LIDOCAINE HCL (CARDIAC) PF 100 MG/5ML IV SOSY
PREFILLED_SYRINGE | INTRAVENOUS | Status: DC | PRN
  Administered 2022-05-15: 50 mg via INTRATRACHEAL

## 2022-05-15 MED ORDER — PROPOFOL 10 MG/ML IV BOLUS
INTRAVENOUS | Status: DC | PRN
  Administered 2022-05-15: 100 mg via INTRAVENOUS
  Administered 2022-05-15: 150 mg via INTRAVENOUS

## 2022-05-15 MED ORDER — DEXMEDETOMIDINE HCL IN NACL 200 MCG/50ML IV SOLN
INTRAVENOUS | Status: DC | PRN
  Administered 2022-05-15: 8 ug via INTRAVENOUS

## 2022-05-15 MED ORDER — PROPOFOL 500 MG/50ML IV EMUL
INTRAVENOUS | Status: DC | PRN
  Administered 2022-05-15: 200 ug/kg/min via INTRAVENOUS

## 2022-05-15 MED ORDER — LACTATED RINGERS IV SOLN: INTRAVENOUS | Status: DC

## 2022-05-15 NOTE — Interval H&P Note (Signed)
History and Physical Interval Note:  05/15/2022 12:02 PM  Alexander Gilbert  has presented today for surgery, with the diagnosis of history of anal fissure,rectal bleeding.  The various methods of treatment have been discussed with the patient and family. After consideration of risks, benefits and other options for treatment, the patient has consented to  Procedure(s) with comments: COLONOSCOPY WITH PROPOFOL (N/A) - 1:45 pm as a surgical intervention.  The patient's history has been reviewed, patient examined, no change in status, stable for surgery.  I have reviewed the patient's chart and labs.  Questions were answered to the patient's satisfaction.     Eloise Harman

## 2022-05-15 NOTE — Anesthesia Preprocedure Evaluation (Addendum)
Anesthesia Evaluation  Patient identified by MRN, date of birth, ID band Patient awake    Reviewed: Allergy & Precautions, H&P , NPO status , Patient's Chart, lab work & pertinent test results  Airway Mallampati: III  TM Distance: >3 FB Neck ROM: Full    Dental  (+) Dental Advisory Given, Teeth Intact Fillings :   Pulmonary Current Smoker and Patient abstained from smoking.   Pulmonary exam normal breath sounds clear to auscultation       Cardiovascular negative cardio ROS Normal cardiovascular exam Rhythm:Regular Rate:Normal     Neuro/Psych negative neurological ROS  negative psych ROS   GI/Hepatic ,GERD (takes tums)  Controlled,,(+)     substance abuse (last use - 05/14/22)  marijuana use  Endo/Other  negative endocrine ROS    Renal/GU negative Renal ROS  negative genitourinary   Musculoskeletal negative musculoskeletal ROS (+)    Abdominal   Peds negative pediatric ROS (+)  Hematology negative hematology ROS (+)   Anesthesia Other Findings   Reproductive/Obstetrics negative OB ROS                             Anesthesia Physical Anesthesia Plan  ASA: 2  Anesthesia Plan: General   Post-op Pain Management: Minimal or no pain anticipated   Induction: Intravenous  PONV Risk Score and Plan: Propofol infusion  Airway Management Planned: Nasal Cannula and Natural Airway  Additional Equipment:   Intra-op Plan:   Post-operative Plan:   Informed Consent: I have reviewed the patients History and Physical, chart, labs and discussed the procedure including the risks, benefits and alternatives for the proposed anesthesia with the patient or authorized representative who has indicated his/her understanding and acceptance.     Dental advisory given  Plan Discussed with: CRNA and Surgeon  Anesthesia Plan Comments:        Anesthesia Quick Evaluation

## 2022-05-15 NOTE — Anesthesia Postprocedure Evaluation (Signed)
Anesthesia Post Note  Patient: Alexander Gilbert  Procedure(s) Performed: COLONOSCOPY WITH PROPOFOL  Patient location during evaluation: Phase II Anesthesia Type: General Level of consciousness: awake and alert and oriented Pain management: pain level controlled Vital Signs Assessment: post-procedure vital signs reviewed and stable Respiratory status: spontaneous breathing, nonlabored ventilation and respiratory function stable Cardiovascular status: blood pressure returned to baseline and stable Postop Assessment: no apparent nausea or vomiting Anesthetic complications: no  No notable events documented.   Last Vitals:  Vitals:   05/15/22 1249 05/15/22 1252  BP:  (!) 93/47  Pulse: (!) 54 64  Resp: 20 20  Temp:    SpO2: 98% 98%    Last Pain:  Vitals:   05/15/22 1249  TempSrc:   PainSc: 0-No pain                 Camani Sesay C Latoria Dry

## 2022-05-15 NOTE — Transfer of Care (Signed)
Immediate Anesthesia Transfer of Care Note  Patient: Alexander Gilbert  Procedure(s) Performed: COLONOSCOPY WITH PROPOFOL  Patient Location: Endoscopy Unit  Anesthesia Type:General  Level of Consciousness: awake, alert , oriented, and patient cooperative  Airway & Oxygen Therapy: Patient Spontanous Breathing  Post-op Assessment: Report given to RN, Post -op Vital signs reviewed and stable, and Patient moving all extremities  Post vital signs: Reviewed and stable  Last Vitals:  Vitals Value Taken Time  BP    Temp 36.8 C 05/15/22 1241  Pulse 63 05/15/22 1241  Resp 18 05/15/22 1241  SpO2 98 % 05/15/22 1241    Last Pain:  Vitals:   05/15/22 1241  TempSrc: Oral  PainSc:       Patients Stated Pain Goal: 3 (03/13/14 9458)  Complications: No notable events documented.

## 2022-05-15 NOTE — Op Note (Signed)
[facility] Patient Name: Alexander Gilbert Procedure Date: 05/15/2022 12:15 PM MRN: [id number] Date of Birth: 2000-05-27 Attending MD: Elon Alas. Abbey Chatters , [place], [id number] CSN: [id number] Age: 22 Admit Type: Outpatient Procedure:                Colonoscopy Indications:              Rectal bleeding Providers:                Elon Alas. Abbey Chatters, DO, Campbell Page, Casimer Bilis, Technician Referring MD:             Elon Alas. Abbey Chatters, DO Medicines:                See the Anesthesia note for documentation of the                            administered medications Complications:            No immediate complications. Estimated Blood Loss:     Estimated blood loss: none. Procedure:                Pre-Anesthesia Assessment:                           - The anesthesia plan was to use monitored                            anesthesia care (MAC).                           After obtaining informed consent, the colonoscope                            was passed under direct vision. Throughout the                            procedure, the patient's blood pressure, pulse, and                            oxygen saturations were monitored continuously. The                            [id number]-HQ190L ([id number]) scope was introduced through                            the anus and advanced to the the terminal ileum,                            with identification of the appendiceal orifice and                            IC valve. The colonoscopy was performed without                            difficulty. The patient tolerated the  procedure                            well. The quality of the bowel preparation was                            evaluated using the BBPS [facility] Bowel Preparation                            Scale) with scores of: Right Colon = 3, Transverse                            Colon = 3 and Left Colon = 3 (entire mucosa seen                            well with no  residual staining, small fragments of                            stool or opaque liquid). The total BBPS score                            equals 9. Scope In: 12:27:50 PM Scope Out: 12:37:51 PM Scope Withdrawal Time: 0 hours 6 minutes 19 seconds  Total Procedure Duration: 0 hours 10 minutes 1 second  Findings:      The perianal and digital rectal examinations were normal. No anal       fissure identified      Non-bleeding internal hemorrhoids were found during retroflexion.      The terminal ileum appeared normal.      The exam was otherwise without abnormality. Impression:               - Non-bleeding internal hemorrhoids.                           - The examined portion of the ileum was normal.                           - The examination was otherwise normal.                           - No specimens collected. Moderate Sedation:      Per Anesthesia Care Recommendation:           - Patient has a contact number available for                            emergencies. The signs and symptoms of potential                            delayed complications were discussed with the                            patient. Return to normal activities tomorrow.  Written discharge instructions were provided to the                            patient.                           - Resume previous diet.                           - Continue present medications.                           - Repeat colonoscopy at age 76 for screening                            purposes.                           - Return to GI clinic in 3 months. Consider                            hemorrhoid banding Procedure Code(s):        --- Professional ---                           [phone], Colonoscopy, flexible; diagnostic, including                            collection of specimen(s) by brushing or washing,                            when performed (separate procedure) Diagnosis Code(s):        ---  Professional ---                           K64.8, Other hemorrhoids                           K62.5, Hemorrhage of anus and rectum CPT copyright 2022 American Medical Association. All rights reserved. The codes documented in this report are preliminary and upon coder review may  be revised to meet current compliance requirements. Elon Alas. Abbey Chatters, DO [place] Abbey Chatters, DO 05/15/2022 12:42:48 PM This report has been signed electronically. Number of Addenda: 0

## 2022-05-15 NOTE — Discharge Instructions (Addendum)
  Colonoscopy Discharge Instructions  Read the instructions outlined below and refer to this sheet in the next few weeks. These discharge instructions provide you with general information on caring for yourself after you leave the hospital. Your doctor may also give you specific instructions. While your treatment has been planned according to the most current medical practices available, unavoidable complications occasionally occur.   ACTIVITY You may resume your regular activity, but move at a slower pace for the next 24 hours.  Take frequent rest periods for the next 24 hours.  Walking will help get rid of the air and reduce the bloated feeling in your belly (abdomen).  No driving for 24 hours (because of the medicine (anesthesia) used during the test).   Do not sign any important legal documents or operate any machinery for 24 hours (because of the anesthesia used during the test).  NUTRITION Drink plenty of fluids.  You may resume your normal diet as instructed by your doctor.  Begin with a light meal and progress to your normal diet. Heavy or fried foods are harder to digest and may make you feel sick to your stomach (nauseated).  Avoid alcoholic beverages for 24 hours or as instructed.  MEDICATIONS You may resume your normal medications unless your doctor tells you otherwise.  WHAT YOU CAN EXPECT TODAY Some feelings of bloating in the abdomen.  Passage of more gas than usual.  Spotting of blood in your stool or on the toilet paper.  IF YOU HAD POLYPS REMOVED DURING THE COLONOSCOPY: No aspirin products for 7 days or as instructed.  No alcohol for 7 days or as instructed.  Eat a soft diet for the next 24 hours.  FINDING OUT THE RESULTS OF YOUR TEST Not all test results are available during your visit. If your test results are not back during the visit, make an appointment with your caregiver to find out the results. Do not assume everything is normal if you have not heard from your  caregiver or the medical facility. It is important for you to follow up on all of your test results.  SEEK IMMEDIATE MEDICAL ATTENTION IF: You have more than a spotting of blood in your stool.  Your belly is swollen (abdominal distention).  You are nauseated or vomiting.  You have a temperature over 101.  You have abdominal pain or discomfort that is severe or gets worse throughout the day.   Overall your colon looked very healthy.  I did not see any active inflammation indicative of underlying inflammatory bowel disease such as Crohn's disease or ulcerative colitis.  The end portion of your small bowel called the terminal ileum also appeared normal.  No polyps or evidence of cancer.  Repeat colonoscopy at age 22 for colon cancer screening purposes.  You do have internal hemorrhoids which is likely the cause of your bleeding.  We can discuss banding in the clinic.  Follow-up with GI in 3 months.  I hope you have a great rest of your week!  Elon Alas. Abbey Chatters, D.O. Gastroenterology and Hepatology [facility]

## 2022-05-20 ENCOUNTER — Encounter (HOSPITAL_COMMUNITY): Payer: Self-pay | Admitting: Internal Medicine

## 2022-06-09 ENCOUNTER — Ambulatory Visit (INDEPENDENT_AMBULATORY_CARE_PROVIDER_SITE_OTHER): Payer: Medicaid Other | Admitting: Family Medicine

## 2022-06-09 ENCOUNTER — Encounter: Payer: Self-pay | Admitting: Family Medicine

## 2022-06-09 VITALS — BP 122/72 | Temp 97.2°F | Ht 69.0 in | Wt 137.0 lb

## 2022-06-09 DIAGNOSIS — J019 Acute sinusitis, unspecified: Secondary | ICD-10-CM | POA: Insufficient documentation

## 2022-06-09 MED ORDER — AMOXICILLIN-POT CLAVULANATE 875-125 MG PO TABS: 1.0000 | ORAL_TABLET | Freq: Two times a day (BID) | ORAL | 0 refills | Status: DC

## 2022-06-09 NOTE — Assessment & Plan Note (Signed)
Treating with Augmentin. 

## 2022-06-09 NOTE — Progress Notes (Signed)
Subjective:  Patient ID: Alexander Gilbert, male    DOB: [birth year]  Age: 22 y.o. MRN: [id number]  CC: Chief Complaint  Patient presents with   Sinus Problem    Patient arrives with sinus symptoms- believes he has an infection- just had nose bleed from blowing nose so much    HPI:  22 year old male presents for evaluation of the above.  Patient reports that he has been sick for the past several days. He reports sinus congestion, subjective fever, cough, watery eyes. Has been blowing his nose frequently and had a significant nose bleed today. No relieving factors. No reported close sick contacts.   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   GERD (gastroesophageal reflux disease) 11/02/2016   Allergic rhinitis 08/18/2013    Social Hx   Social History   Socioeconomic History   Marital status: Single    Spouse name: Not on file   Number of children: Not on file   Years of education: Not on file   Highest education level: Not on file  Occupational History   Not on file  Tobacco Use   Smoking status: Some Days    Types: Cigarettes   Smokeless tobacco: Never  Vaping Use   Vaping Use: Never used  Substance and Sexual Activity   Alcohol use: Yes    Comment: rare   Drug use: Yes    Types: Marijuana    Comment: last used marijuana yesterday   Sexual activity: Yes  Other Topics Concern   Not on file  Social History Narrative   Not on file   Social Determinants of Health   Financial Resource Strain: Not on file  Food Insecurity: Not on file  Transportation Needs: Not on file  Physical Activity: Not on file  Stress: Not on file  Social Connections: Not on file    Review of Systems Per HPI  Objective:  BP 122/72   Temp (!) 97.2 F (36.2 C) (Oral)   Ht 5\' 9"  (1.753 m)   Wt 137 lb (62.1 kg)   BMI 20.23 kg/m      06/09/2022    4:04 PM 05/15/2022    12:52 PM 05/15/2022   12:47 PM  BP/Weight  Systolic BP 716 93 92  Diastolic BP 72 47 46  Wt. (Lbs) 137    BMI 20.23 kg/m2      Physical Exam Vitals and nursing note reviewed.  Constitutional:      General: He is not in acute distress. HENT:     Head: Normocephalic and atraumatic.     Right Ear: Tympanic membrane normal.     Left Ear: Tympanic membrane normal.     Nose:     Comments: Blood noted in the right nostril. Eyes:     General:        Right eye: No discharge.        Left eye: No discharge.     Conjunctiva/sclera: Conjunctivae normal.  Cardiovascular:     Rate and Rhythm: Normal rate and regular rhythm.  Pulmonary:     Effort: Pulmonary effort is normal.     Breath sounds: Normal breath sounds.  Neurological:     Mental Status: He is alert.     Lab Results  Component Value Date   WBC 5.8 02/08/2017   HGB 16.5 02/08/2017   HCT 46.1 02/08/2017   PLT  169 02/08/2017   GLUCOSE 95 02/08/2017   ALT 18 02/08/2017   AST 16 02/08/2017   NA 143 02/08/2017   K 4.4 02/08/2017   CL 101 02/08/2017   CREATININE 0.74 (L) 02/08/2017   BUN 15 02/08/2017   CO2 30 (H) 02/08/2017   TSH 2.450 02/08/2017     Assessment & Plan:   Problem List Items Addressed This Visit       Respiratory   Acute rhinosinusitis - Primary    Treating with Augmentin.      Relevant Medications   amoxicillin-clavulanate (AUGMENTIN) 875-125 MG tablet    Meds ordered this encounter  Medications   amoxicillin-clavulanate (AUGMENTIN) 875-125 MG tablet    Sig: Take 1 tablet by mouth 2 (two) times daily.    Dispense:  14 tablet    Refill:  0    Follow-up:  Return if symptoms worsen or fail to improve.  [place]

## 2022-06-09 NOTE — Patient Instructions (Signed)
Antibiotic as prescribed.  OTC nasal saline to help prevent nose bleeds.  Take care  Dr. Lacinda Axon

## 2022-06-15 ENCOUNTER — Encounter: Payer: Self-pay | Admitting: Gastroenterology

## 2022-06-24 ENCOUNTER — Telehealth: Payer: Self-pay

## 2022-06-24 NOTE — Telephone Encounter (Signed)
Returned the pt's call and no ans/vm to leave a message

## 2022-07-06 NOTE — Progress Notes (Unsigned)
   [place] Banding Procedure Note:   Alexander Gilbert is a 22 y.o. male presenting today for consideration of hemorrhoid banding. Last colonoscopy 05/15/22: non bleeding internal hemorrhoids otherwise normal. Advised to repeat colonoscopy at age 87 and consider hemorrhoid banding.   Patient seen 05/07/22 for rectal pain and bristol 4-6 stools. Chronic intermittent abdominal pain. 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 Has occasional flares of rectal pain. Discomfort with DRE, possible posterior anal fissure. Scheduled for colonoscopy. Advised Xifaxin TID for 2 weeks. Continue compounded hemorrhoid cream with nifedipine as needed.   The patient presents with symptomatic grade *** hemorrhoids, unresponsive to maximal medical therapy, requesting rubber band ligation of his/her hemorrhoidal disease. All risks, benefits, and alternative forms of therapy were described and informed consent was obtained.  In the left lateral decubitus position (if anoscopy is performed) anoscopic examination revealed grade *** hemorrhoids in the *** position (s).  The decision was made to band the *** internal hemorrhoid, and the [place] was used to perform band ligation without complication. Digital anorectal examination was then performed to assure proper positioning of the band, and to adjust the banded tissue as required. The patient was discharged home without pain or other issues. Dietary and behavioral recommendations were given and (if necessary prescriptions were given), along with follow-up instructions. The patient will return in 2-3 weeks*** for followup and possible additional banding as required.  No complications were encountered and the patient tolerated the procedure well.    Venetia Night, MSN, FNP-BC, AGACNP-BC [facility]

## 2022-07-07 ENCOUNTER — Encounter: Payer: Self-pay | Admitting: Gastroenterology

## 2022-07-07 ENCOUNTER — Ambulatory Visit (INDEPENDENT_AMBULATORY_CARE_PROVIDER_SITE_OTHER): Payer: Medicaid Other | Admitting: Gastroenterology

## 2022-07-07 VITALS — BP 119/77 | HR 71 | Temp 97.8°F | Ht 69.0 in | Wt 138.7 lb

## 2022-07-07 DIAGNOSIS — K649 Unspecified hemorrhoids: Secondary | ICD-10-CM

## 2022-07-07 DIAGNOSIS — K594 Anal spasm: Secondary | ICD-10-CM | POA: Diagnosis not present

## 2022-07-07 DIAGNOSIS — K602 Anal fissure, unspecified: Secondary | ICD-10-CM

## 2022-07-07 NOTE — Patient Instructions (Signed)
I will call in a compounded [facility] hemorrhoid cream 3 with lidocaine and diltiazem.  Please uses 3-4 times a day for 7-10 days then as needed.  Given the severe spasm, unclear of fissure versus hemorrhoid I would recommend you follow-up with general surgery to see if there can be a more formal evaluation and discuss additional treatment options.  Will have you follow-up in 2 months, sooner if needed.  It was a pleasure to see you today. I want to create trusting relationships with patients. If you receive a survey regarding your visit,  I greatly appreciate you taking time to fill this out on paper or through your MyChart. I value your feedback.  Venetia Night, MSN, FNP-BC, AGACNP-BC [facility]

## 2022-07-30 ENCOUNTER — Other Ambulatory Visit: Payer: Self-pay | Admitting: *Deleted

## 2022-07-30 ENCOUNTER — Ambulatory Visit (INDEPENDENT_AMBULATORY_CARE_PROVIDER_SITE_OTHER): Payer: Medicaid Other | Admitting: Gastroenterology

## 2022-07-30 ENCOUNTER — Ambulatory Visit: Payer: Medicaid Other | Admitting: Gastroenterology

## 2022-07-30 ENCOUNTER — Encounter: Payer: Self-pay | Admitting: Gastroenterology

## 2022-07-30 VITALS — BP 121/68 | HR 80 | Temp 97.3°F | Ht 69.0 in | Wt 139.4 lb

## 2022-07-30 DIAGNOSIS — K594 Anal spasm: Secondary | ICD-10-CM

## 2022-07-30 DIAGNOSIS — K6289 Other specified diseases of anus and rectum: Secondary | ICD-10-CM

## 2022-07-30 DIAGNOSIS — K602 Anal fissure, unspecified: Secondary | ICD-10-CM

## 2022-07-30 NOTE — Patient Instructions (Signed)
Use the cream 3 times per day.   Avoid straining, limit toilet time to 2-3 minutes.  Add benefiber 2 teaspoons daily in the beverage of your choice. If this is not helpful, please let us know. I want to make sure your stool stays soft, productive, and not multiple trips to the bathroom.  We are referring you to [facility] in [place], [place] for evaluation!  Please call with any concerns.  I enjoyed seeing you again today! At our first visit, I mentioned how I value our relationship and want to provide genuine, compassionate, and quality care. You may receive a survey regarding your visit with me, and I welcome your feedback! Thanks so much for taking the time to complete this. I look forward to seeing you again.   Annitta Needs, PhD, ANP-BC [facility]

## 2022-07-30 NOTE — Progress Notes (Signed)
Last colonoscopy 05/15/22: non bleeding internal hemorrhoids otherwise normal.  Has previously tried nifedipine compounded at [facility]. Per notes on 2/27 when seen for possible banding, recommendations were for compounded cream with lidocaine and diltiazem 2%. Patient did not pick up. Plans to refer to General Surgery, but I do not see where this was completed.    Pain is better by about 50%. Was at an 24, now a steady 4. Pain starts up after BM. When waking will sometimes have flares if gets angry. Feels like always hurting. Knife-like pain but has dulled down. BM about once to every 2 days. Stool is soft. Sometimes feels like more in him.       Gastroenterology Office Note     Primary Care Physician:  Coral Spikes, DO  Primary Gastroenterologist: Dr. Abbey Chatters   Chief Complaint   Chief Complaint  Patient presents with   Follow-up    Follow up anal fissures and anal spasm according to [facility]     History of Present Illness   Alexander Gilbert is a 22 y.o. male presenting today in follow-up today for rectal pain. Initially seen in Dec 2023. Chronic rectal pain dating over 2 years. He has been treated with compounded nifedipine and most recently diltiazem.    Last colonoscopy 05/15/22: non bleeding internal hemorrhoids otherwise normal.  Has previously tried nifedipine compounded at [facility]. Per notes on 2/27 when seen for possible banding, recommendations were for compounded cream with lidocaine and diltiazem 2%. Patient has been using this twice per day. . Plans to refer to General Surgery, but this was inadvertently not completed.    Pain is better by about 50%. Was at an 6, now a steady 4. Pain starts up after BM. Feels like always hurting. Knife-like pain but has dulled down. BM about once to every 2 days. Stool is soft. Sometimes feels like more in him.     Past Medical History:  Diagnosis Date   Allergy    Hx of fracture of arm age 27 or 4   right     Past Surgical History:  Procedure Laterality Date   COLONOSCOPY WITH PROPOFOL N/A 05/15/2022   Procedure: COLONOSCOPY WITH PROPOFOL;  Surgeon: Eloise Harman, DO;  Location: [facility];  Service: Endoscopy;  Laterality: N/A;  1:45 pm   FRACTURE SURGERY  2005   right elbow   WISDOM TOOTH EXTRACTION      Current Outpatient Medications  Medication Sig Dispense Refill   aspirin-acetaminophen-caffeine (EXCEDRIN MIGRAINE) 250-250-65 MG tablet Take 2 tablets by mouth every other day as needed for headache.     No current facility-administered medications for this visit.    Allergies as of 07/30/2022   (No Known Allergies)    Family History  Problem Relation Age of Onset   Diabetes Maternal Grandmother    Diabetes Maternal Grandfather    Heart disease Paternal Grandmother    Heart disease Paternal Grandfather    Colon cancer Neg Hx    Gastric cancer Neg Hx    Esophageal cancer Neg Hx    Colon polyps Neg Hx     Social History   Socioeconomic History   Marital status: Single    Spouse name: Not on file   Number of children: Not on file   Years of education: Not on file   Highest education level: Not on file  Occupational History   Not on file  Tobacco Use   Smoking status: Some Days  Types: Cigarettes   Smokeless tobacco: Never  Vaping Use   Vaping Use: Never used  Substance and Sexual Activity   Alcohol use: Yes    Comment: rare   Drug use: Yes    Types: Marijuana    Comment: last used marijuana yesterday   Sexual activity: Yes  Other Topics Concern   Not on file  Social History Narrative   Not on file   Social Determinants of Health   Financial Resource Strain: Not on file  Food Insecurity: Not on file  Transportation Needs: Not on file  Physical Activity: Not on file  Stress: Not on file  Social Connections: Not on file  Intimate Partner Violence: Not on file     Review of Systems   Gen: Denies any fever, chills, fatigue, weight loss,  lack of appetite.  CV: Denies chest pain, heart palpitations, peripheral edema, syncope.  Resp: Denies shortness of breath at rest or with exertion. Denies wheezing or cough.  GI: Denies dysphagia or odynophagia. Denies jaundice, hematemesis, fecal incontinence. GU : Denies urinary burning, urinary frequency, urinary hesitancy MS: Denies joint pain, muscle weakness, cramps, or limitation of movement.  Derm: Denies rash, itching, dry skin Psych: Denies depression, anxiety, memory loss, and confusion Heme: Denies bruising, bleeding, and enlarged lymph nodes.   Physical Exam   BP 121/68   Pulse 80   Temp (!) 97.3 F (36.3 C)   Ht 5\' 9"  (1.753 m)   Wt 139 lb 6.4 oz (63.2 kg)   BMI 20.59 kg/m  General:   Alert and oriented. Pleasant and cooperative. Well-nourished and well-developed.  Head:  Normocephalic and atraumatic. Eyes:  Without icterus Rectal:  no external hemorrhoids. DRE with discomfort, tight sphincter. Unable to complete exam.  Msk:  Symmetrical without gross deformities. Normal posture. Extremities:  Without edema. Neurologic:  Alert and  oriented x4;  grossly normal neurologically. Skin:  Intact without significant lesions or rashes. Psych:  Alert and cooperative. Normal mood and affect.   Assessment   Alexander Gilbert is a 22 y.o. 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 Symptoms chronic at least over past 2 years.   Colonoscopy on file in Jan 2024 with internal hemorrhoids. Currently using compounded cream with diltiazem that has offered some relief by about 50%. Persistent symptoms are bothersome for patient.  It is encouraging he has noted some improvement with diltiazem/lidocaine/hydrocortisone compounded by [facility]. I have asked him to increase this to TID. Due to chronicity of symptoms, we are referring to colorectal surgery in [place]. He may need  exam under anesthesia; he is unable to tolerate DRE today.     PLAN   Continue compounded cream per rectum, TID Add Benefiber Avoid straining, limit toilet time to 2-3 minutes Refer patient to [facility] colorectal surgeon   Annitta Needs, PhD, ANP-BC [facility]

## 2022-08-03 ENCOUNTER — Telehealth: Payer: Self-pay | Admitting: *Deleted

## 2022-08-03 NOTE — Telephone Encounter (Signed)
Pt left vm stating that he received a call from [facility] and they told him they were not in network with his insurance. He said a consultation is $250, says he doesn't mind paying but would like to go with someone who took his insurance. Please advise. Thank you.

## 2022-08-03 NOTE — Telephone Encounter (Signed)
Per [facility]""Rejection Reason - Patient Declined - His insurance is OON. He would like to be seen at an office that accepts his insurance."  He will need to call his insurance and find out where is in network and then we can refer him there. Thanks!

## 2022-08-03 NOTE — Telephone Encounter (Signed)
Pt informed and states he will call us back once he finds a general surgery office in network with insurance.

## 2022-08-03 NOTE — Telephone Encounter (Signed)
Please find out where his insurance is accepted, and we can refer there. Thanks!

## 2022-08-06 ENCOUNTER — Ambulatory Visit: Payer: Medicaid Other | Admitting: Gastroenterology

## 2022-08-24 ENCOUNTER — Telehealth: Payer: Medicaid Other | Admitting: Gastroenterology

## 2022-08-24 NOTE — Telephone Encounter (Signed)
Great! Tammy, can we see if he has found a surgeon in his network due to anal fissure?

## 2022-08-24 NOTE — Telephone Encounter (Signed)
Patient came by and brought in new insurance card for [employer].  I scanned into his chart

## 2022-08-24 NOTE — Telephone Encounter (Signed)
Vm not set up. Pt needs to check with insurance to see what surgeon is in network with his insurance.

## 2022-08-25 NOTE — Telephone Encounter (Signed)
Pt informed to check with insurance to see what general surgeon is in network with his insurance and call back then we can refer him.

## 2022-08-31 ENCOUNTER — Ambulatory Visit: Payer: Medicaid Other | Admitting: Family Medicine

## 2022-09-02 ENCOUNTER — Encounter: Payer: Self-pay | Admitting: Family Medicine

## 2022-09-02 ENCOUNTER — Ambulatory Visit (INDEPENDENT_AMBULATORY_CARE_PROVIDER_SITE_OTHER): Payer: 59 | Admitting: Family Medicine

## 2022-09-02 DIAGNOSIS — K648 Other hemorrhoids: Secondary | ICD-10-CM | POA: Diagnosis not present

## 2022-09-02 DIAGNOSIS — K602 Anal fissure, unspecified: Secondary | ICD-10-CM | POA: Diagnosis not present

## 2022-09-02 DIAGNOSIS — K594 Anal spasm: Secondary | ICD-10-CM

## 2022-09-02 MED ORDER — NITROGLYCERIN 0.4 % RE OINT: TOPICAL_OINTMENT | RECTAL | 3 refills | Status: AC

## 2022-09-02 NOTE — Assessment & Plan Note (Signed)
Patient did not tolerate banding in the office.  I have placed a referral to general surgery as recommended by GI.

## 2022-09-02 NOTE — Progress Notes (Signed)
Subjective:  Patient ID: Alexander Gilbert, male    DOB: 03/15/2001  Age: 22 y.o. 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  Patient has been seen by GI.  Has had colonoscopy.  Colonoscopy revealed internal hemorrhoids.  Has had ongoing issues with anal fissure.  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  He was referred to a general surgeon but this referral was closed as they did not accept his insurance.  He has switched insurances and is requesting a new referral today.  Patient is also requesting a refill on cream used for anal fissure.  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   GERD (gastroesophageal reflux disease) 11/02/2016   Allergic rhinitis 08/18/2013    Social Hx   Social History   Socioeconomic History   Marital status: Single    Spouse name: Not on file   Number of children: Not on file   Years of education: Not on file   Highest education level: Not on file  Occupational History   Not on file  Tobacco Use   Smoking status: Some Days    Types: Cigarettes   Smokeless tobacco: Never  Vaping Use   Vaping Use: Never used  Substance and Sexual Activity   Alcohol use: Yes    Comment: rare   Drug use: Yes    Types: Marijuana    Comment: last used marijuana yesterday   Sexual activity: Yes  Other Topics Concern   Not on file  Social History Narrative   Not on file   Social Determinants of Health   Financial Resource Strain: Not on file  Food Insecurity: Not on file  Transportation Needs: Not on file  Physical Activity: Not on file  Stress: Not on file  Social Connections: Not on file    Review of  Systems Per HPI  Objective:  BP 123/80   Temp 98.2 F (36.8 C)   Ht  (1.753 m)   Wt 144 lb (65.3 kg)   SpO2 97%   BMI 21.27 kg/m      09/02/2022   10:19 AM 07/30/2022    8:33 AM 07/07/2022    9:29 AM  BP/Weight  Systolic BP 123 121 119  Diastolic BP 80 68 77  Wt. (Lbs) 144 139.4 138.7  BMI 21.27 kg/m2 20.59 kg/m2 20.48 kg/m2    Physical Exam Vitals and nursing note reviewed.  Constitutional:      General: He is not in acute distress.    Appearance: Normal appearance.  HENT:     Head: Normocephalic and atraumatic.  Cardiovascular:     Rate and Rhythm: Normal rate and regular rhythm.  Pulmonary:     Effort: Pulmonary effort is normal.     Breath sounds: Normal breath sounds.  Neurological:     Mental Status: He is alert.  Psychiatric:        Mood and Affect: Mood normal.        Behavior: Behavior normal.     Lab Results  Component Value Date   WBC 5.8 02/08/2017   HGB 16.5 02/08/2017  HCT 46.1 02/08/2017   PLT 169 02/08/2017   GLUCOSE 95 02/08/2017   ALT 18 02/08/2017   AST 16 02/08/2017   NA 143 02/08/2017   K 4.4 02/08/2017   CL 101 02/08/2017   CREATININE 0.74 (L) 02/08/2017   BUN 15 02/08/2017   CO2 30 (H) 02/08/2017   TSH 2.450 02/08/2017     Assessment & Plan:   Problem List Items Addressed This Visit       Cardiovascular and Mediastinum   Internal hemorrhoids    Patient did not tolerate banding in the office.  I have placed a referral to general surgery as recommended by GI.      Relevant Orders   Ambulatory referral to General Surgery     Digestive   Proctalgia fugax   Relevant Orders   Ambulatory referral to General Surgery   Anal fissure    Nitroglycerin refilled.      Relevant Orders   Ambulatory referral to General Surgery    Meds ordered this encounter  Medications   Nitroglycerin 0.4 % OINT    Sig: Apply 1 inch (375 mg) ointment intra-anally every 12 hours for anal fissure    Dispense:  30 g    Refill:  3     Follow-up:  Return if symptoms worsen or fail to improve.  Everlene Other DO [facility]

## 2022-09-02 NOTE — Patient Instructions (Signed)
Referral placed.  Medication as prescribed.  Take care  Dr. Adriana Simas

## 2022-09-02 NOTE — Assessment & Plan Note (Signed)
Nitroglycerin refilled

## 2022-09-08 ENCOUNTER — Ambulatory Visit: Payer: Medicaid Other | Admitting: Gastroenterology

## 2022-09-21 DIAGNOSIS — K602 Anal fissure, unspecified: Secondary | ICD-10-CM | POA: Diagnosis not present

## 2022-09-21 NOTE — Telephone Encounter (Signed)
Pt says he has an appointment today with [facility] at 3:00 pm. Alexander Gilbert

## 2022-11-24 DIAGNOSIS — K602 Anal fissure, unspecified: Secondary | ICD-10-CM | POA: Diagnosis not present
